# Patient Record
Sex: Female | Born: 1985 | Race: Black or African American | Hispanic: No | Marital: Single
Health system: Southern US, Community
[De-identification: ages and names within clinical notes are randomized; demographics above are authoritative.]

## PROBLEM LIST (undated history)

## (undated) ENCOUNTER — Ambulatory Visit: Discharge status: Absent without leave | Payer: Self-pay

---
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
## 2004-01-18 ENCOUNTER — Emergency Department (HOSPITAL_COMMUNITY): Admission: EM | Admit: 2004-01-18 | Discharge: 2004-01-18 | Payer: Self-pay | Admitting: Emergency Medicine

## 2005-10-05 IMAGING — CR DG FOOT COMPLETE 3+V*L*
2 series · 2 of 2 positions shown · non-contrast
Comparison: none

CLINICAL DATA: The patient fell in a hole and has lateral foot pain. 
 THREE VIEW LEFT FOOT, 01/18/04
 No prior studies.

[view not recorded (1 of 2)]
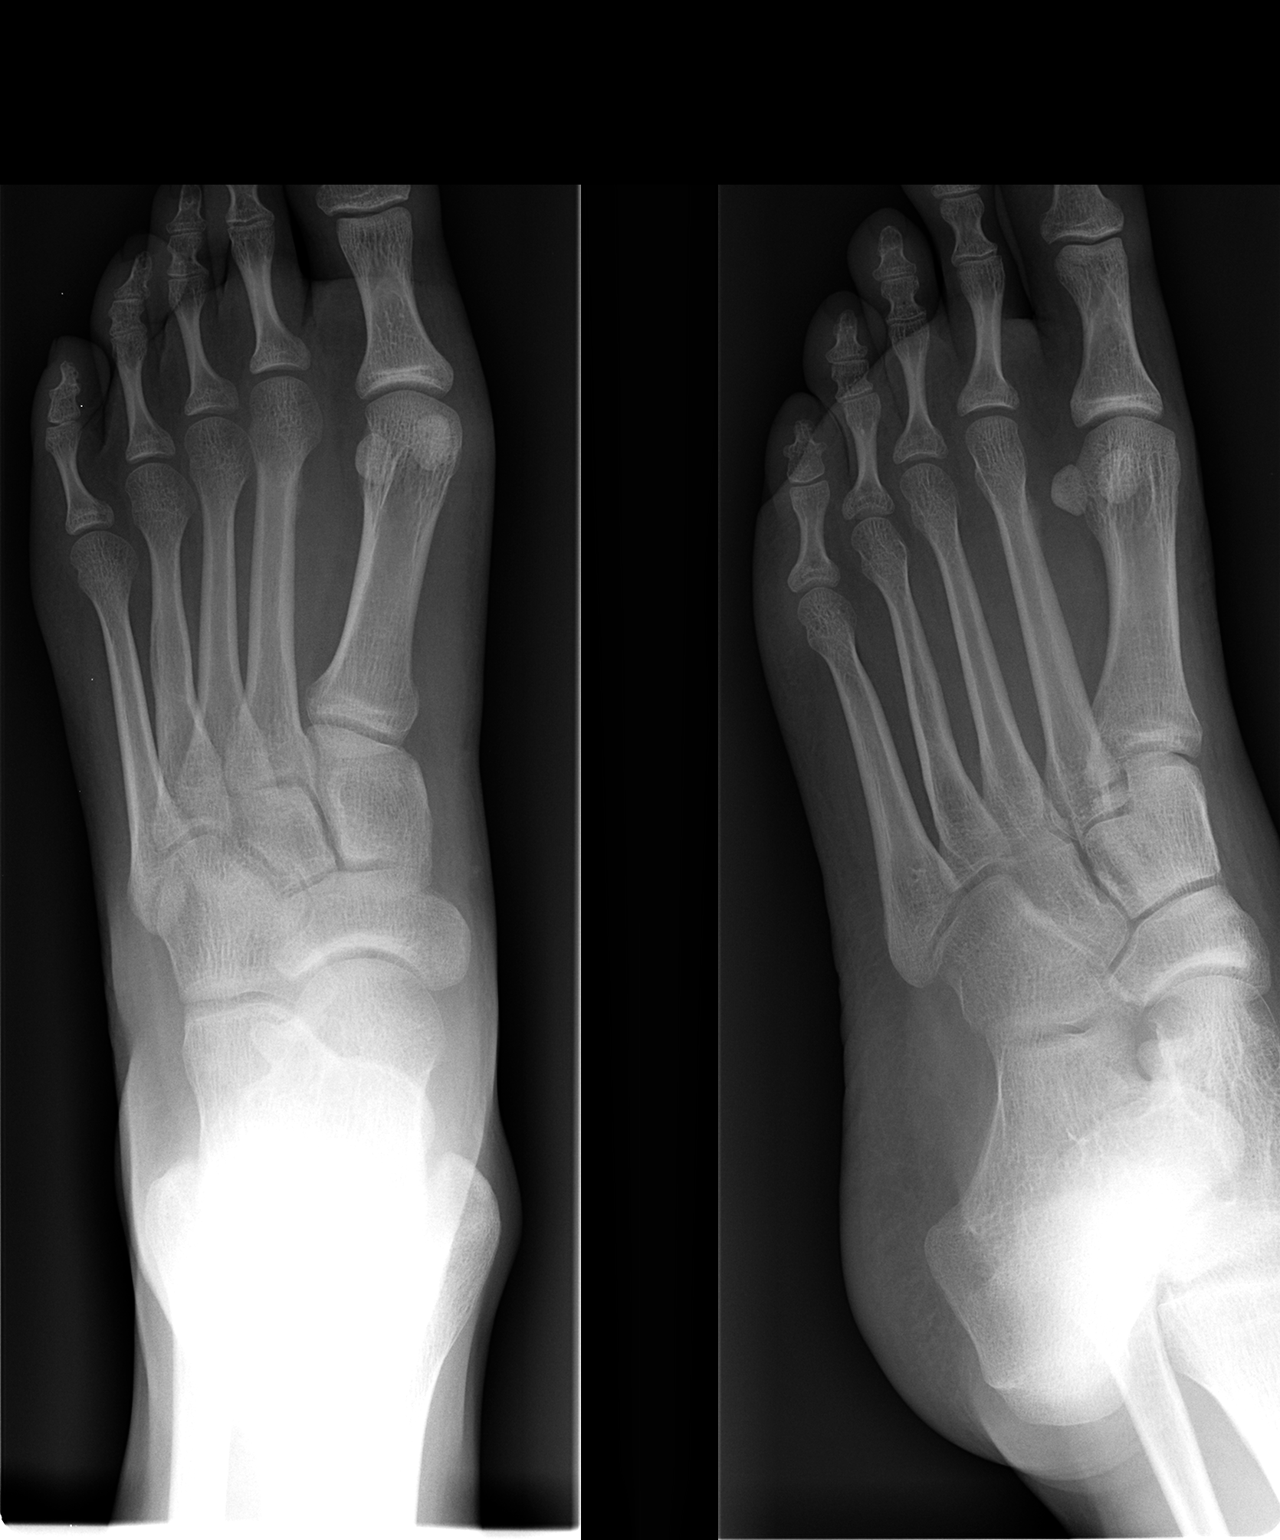

[view not recorded (2 of 2)]
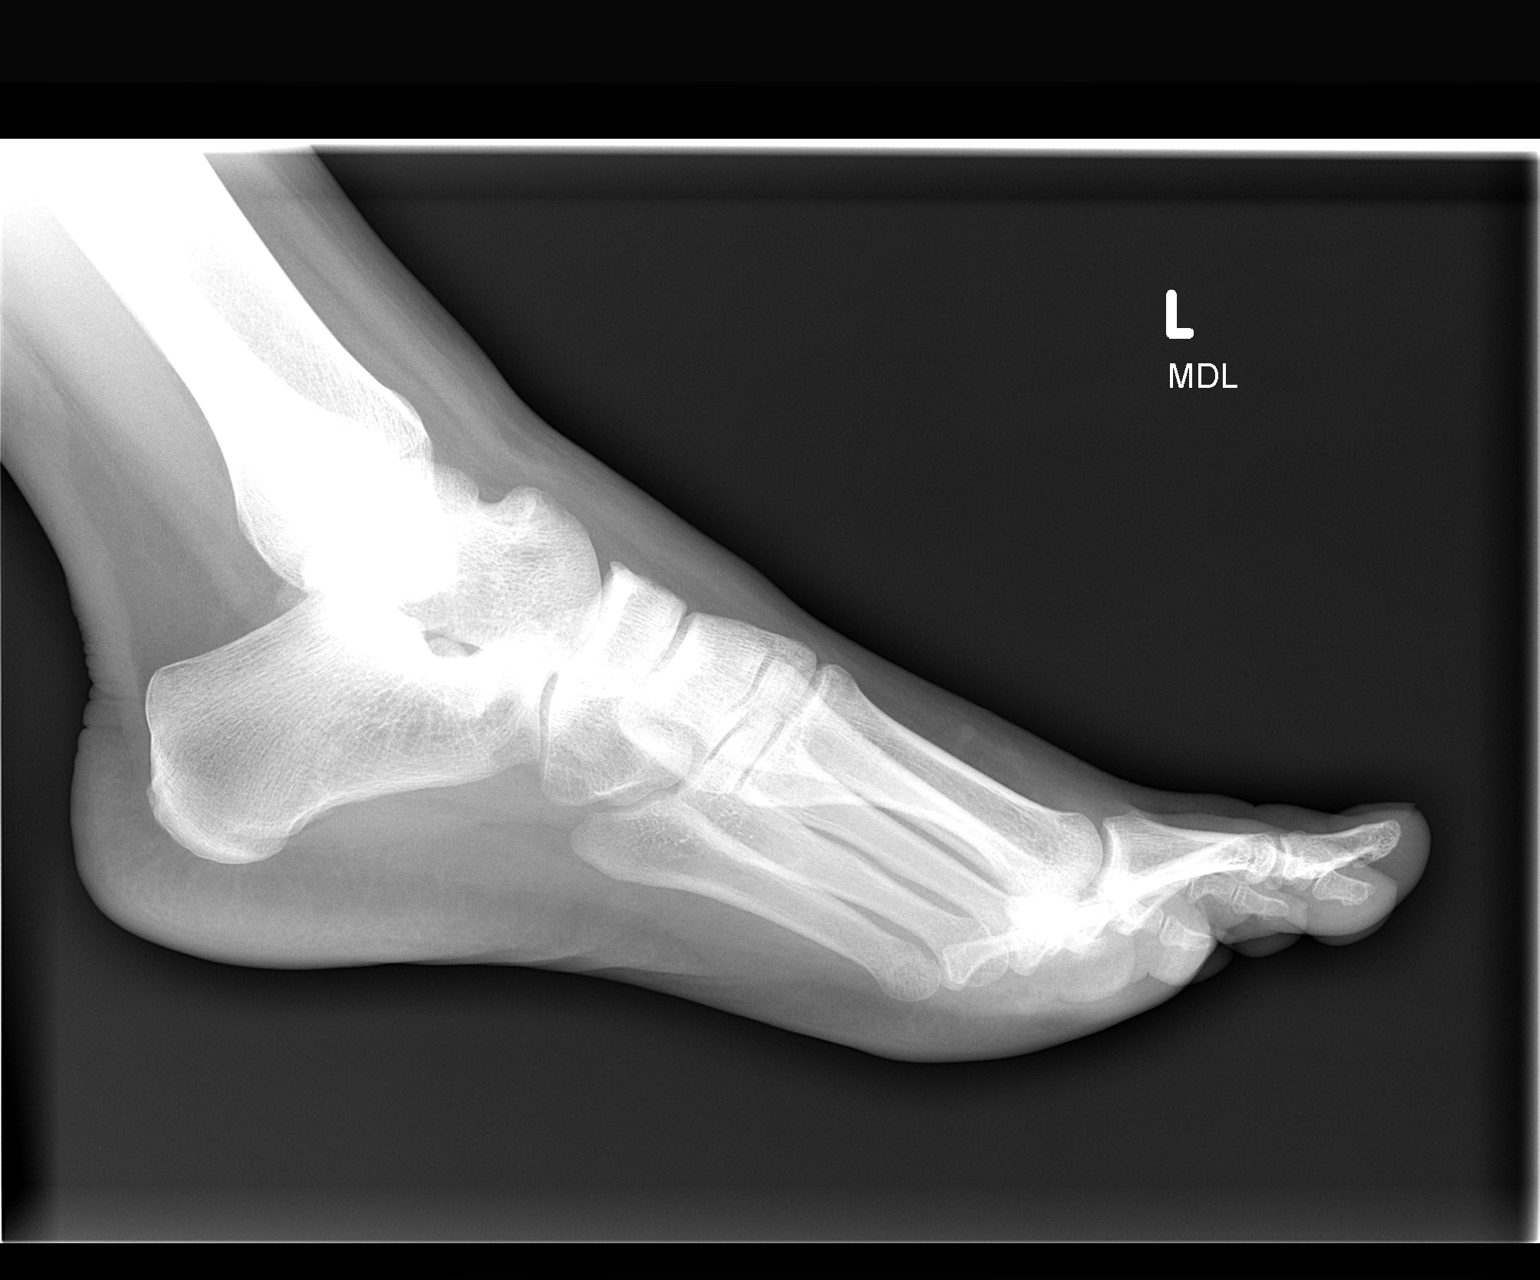

[2 of 2 positions shown; findings below may reference images not displayed]

FINDINGS: The base of the 5th metatarsal appears intact.  Alignment at the Lisfranc joint appears normal.  
 There is some low level callous formation along the distal 2nd metatarsal shaft.  This raises the possibility of 2nd metatarsal stress fracture in the region.  No overt cortical discontinuity is identified.  
 Broad excrescence of the superior aspect of the talar head likely represents a sessile osteochondroma.  
 IMPRESSION
 There is some mild callous formation along the medial aspect of the distal 2nd metatarsal shaft raising the possibility of stress injury.  No overt cortical discontinuity is identified. 
 Probable osteochondroma of the talar head near the junction with the talar neck.

## 2013-09-24 ENCOUNTER — Other Ambulatory Visit: Payer: Self-pay

## 2019-07-09 ENCOUNTER — Ambulatory Visit: Payer: Self-pay | Attending: Internal Medicine

## 2019-07-09 ENCOUNTER — Other Ambulatory Visit: Payer: Self-pay

## 2019-07-09 DIAGNOSIS — Z23 Encounter for immunization: Secondary | ICD-10-CM

## 2019-07-09 NOTE — Progress Notes (Signed)
   Covid-19 Vaccination Clinic  Name:  Caitlin Evans    MRN: [id number] DOB: 02/20/1986  07/09/2019  [name] was observed post Covid-19 immunization for 30 minutes based on pre-vaccination screening without incident. She was provided with Vaccine Information Sheet and instruction to access the V-Safe system.   [name] was instructed to call 911 with any severe reactions post vaccine: Marland Kitchen Difficulty breathing  . Swelling of face and throat  . A fast heartbeat  . A bad rash all over body  . Dizziness and weakness   Immunizations Administered    Name Date Dose VIS Date Route   Pfizer COVID-19 Vaccine 07/09/2019  8:43 AM 0.3 mL 03/19/2019 Intramuscular   Manufacturer: [employer], [employer]   Lot: [phone] [id number]   NDC: [address]-9

## 2019-08-04 ENCOUNTER — Ambulatory Visit: Payer: Self-pay | Attending: Internal Medicine

## 2019-08-04 DIAGNOSIS — Z23 Encounter for immunization: Secondary | ICD-10-CM

## 2019-08-04 NOTE — Progress Notes (Signed)
   Covid-19 Vaccination Clinic  Name:  Renny Gunnarson    MRN: [id number] DOB: 1985-11-10  08/04/2019  Ms. Carie was observed post Covid-19 immunization for 15 minutes without incident. She was provided with Vaccine Information Sheet and instruction to access the V-Safe system.   Ms. Gueye was instructed to call 911 with any severe reactions post vaccine: Marland Kitchen Difficulty breathing  . Swelling of face and throat  . A fast heartbeat  . A bad rash all over body  . Dizziness and weakness   Immunizations Administered    Name Date Dose VIS Date Route   Pfizer COVID-19 Vaccine 08/04/2019  8:27 AM 0.3 mL 06/02/2018 Intramuscular   Manufacturer: [employer], [employer]   Lot: [id number]   NDC: [address]-8
